# Patient Record
Sex: Male | Born: 2000 | Race: Black or African American | Hispanic: No | Marital: Single | Smoking: Never smoker
Health system: Southern US, Community
[De-identification: ages and names within clinical notes are randomized; demographics above are authoritative.]

## PROBLEM LIST (undated history)

## (undated) DIAGNOSIS — R569 Unspecified convulsions: Secondary | ICD-10-CM

## (undated) HISTORY — DX: Unspecified convulsions: R56.9

---
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
## 2021-03-31 ENCOUNTER — Other Ambulatory Visit: Payer: Self-pay

## 2021-03-31 ENCOUNTER — Emergency Department (HOSPITAL_COMMUNITY)
Admission: EM | Admit: 2021-03-31 | Discharge: 2021-04-01 | Discharge status: Against Medical Advice | Payer: Federal, State, Local not specified - PPO

## 2021-03-31 NOTE — ED Notes (Signed)
Pt called for triage with no response. 

## 2021-04-17 ENCOUNTER — Ambulatory Visit
Admission: RE | Admit: 2021-04-17 | Discharge: 2021-04-17 | Disposition: A | Discharge status: Home / Self Care | Payer: Federal, State, Local not specified - PPO | Source: Ambulatory Visit | Attending: Family Medicine | Admitting: Family Medicine

## 2021-04-17 ENCOUNTER — Ambulatory Visit: Payer: Federal, State, Local not specified - PPO | Admitting: Pulmonary Disease

## 2021-04-17 ENCOUNTER — Other Ambulatory Visit: Payer: Self-pay | Admitting: Family Medicine

## 2021-04-17 DIAGNOSIS — M79645 Pain in left finger(s): Secondary | ICD-10-CM

## 2021-06-08 ENCOUNTER — Encounter: Payer: Self-pay | Admitting: Neurology

## 2021-06-08 ENCOUNTER — Ambulatory Visit (INDEPENDENT_AMBULATORY_CARE_PROVIDER_SITE_OTHER): Payer: Federal, State, Local not specified - PPO | Admitting: Neurology

## 2021-06-08 VITALS — BP 129/85 | HR 91 | Ht 73.0 in | Wt 260.5 lb

## 2021-06-08 DIAGNOSIS — G40309 Generalized idiopathic epilepsy and epileptic syndromes, not intractable, without status epilepticus: Secondary | ICD-10-CM | POA: Diagnosis not present

## 2021-06-08 MED ORDER — LACOSAMIDE 100 MG PO TABS: 100.0000 mg | ORAL_TABLET | Freq: Two times a day (BID) | ORAL | 4 refills | Status: DC

## 2021-06-08 NOTE — Patient Instructions (Signed)
Continue with Vimpat 100 mg twice daily, refill given to patient ?Follow-up in 1 year, at that time if no reported seizure, we will obtain a 24 to 48-hours ambulatory EEG and based on result we will discuss coming off antiseizure medication. ?

## 2021-06-08 NOTE — Progress Notes (Signed)
? ?[facility] ? ?PATIENT: Curtis Stark ?DOB: [birth year] ? ?REFERRING CLINICIAN: No ref. provider found ?HISTORY FROM: Patient and mother over the phone  ?REASON FOR VISIT: Establish Care   ? ? ?HISTORICAL ? ?CHIEF COMPLAINT:  ?Chief Complaint  ?Patient presents with  ? Follow-up  ?  Rm 13. Alone. ?Denies any new seizure activity.  ? ?INTERVAL HISTORY 06/08/2021:  ?Patient presents today for follow-up, since last visit in September, he denies any additional seizures.  At last visit his Vimpat level was undetected.  I did contact him and stressed the need to be compliant with medication.  He still on Vimpat 100 mg twice daily, now reports compliance with the medication.  Denies any side effect from the medication.  He is interested in coming off medication.  His last seizure was in 2014. ? ?HISTORY OF PRESENT ILLNESS:  ?This is a 21 year old gentleman who is presenting to establish care for his seizure disorder.  Per mom seizure started when he was in elementary.  At that time he was having blank stare and head without wall turning to the right.  Then seizures involve to show upper body turning to the right with blank stare and stiffness.  Mother did denies any tongue biting urinary incontinence or tonic-clonic activity.  Initially patient has been on multiple AEDs including Depakote, topiramate, levetiracetam, Lamictal, Tegretol and clonazepam.  In 2014 he was admitted to the EMU following the study at that time he was on Depakote and topiramate was having a lot of seizure.  On discharge from that admission he was started on lacosamide 100 mg twice daily and no seizures since then.  His last seizure was in 2014.  He moved from [place] to [place] to attend no current currently in ENT state, he is studying business administration.  No other complaint.  Reported that his diet is good, sleep is good, and he exercises.  No other complaint or concern.   ? ? ?Handedness: Right handed  ? ?Seizure  Type: Focal per semiology (body turning to the right and stiffness) but generalized electrographically.  ? ?Current frequency: No seizures since 2014 ? ?Any injuries from seizures: None ? ?Seizure risk factors: None  ? ?Previous ASMs: Depakote, Topiramate, Keppra, Lamictal, Tegretol, Clonazepam  ? ?Currenty ASMs: Vimpat 100 gm BID  ? ?ASMs side effects: None  ? ?Brain Images: 2014: Normal brain MRI. ? ?Previous EEGs:  ? ? ?OTHER MEDICAL CONDITIONS: None  ? ?REVIEW OF SYSTEMS: Full 14 system review of systems performed and negative with exception of: None  ? ?ALLERGIES: ?Allergies  ?Allergen Reactions  ? Carbamazepine Hives, Itching, Rash and Swelling  ?  Red man rash  ? Clonazepam   ?  Red man rash ? ?Other reaction(s): Vancomycin Infusion Reaction ?Other reaction(s): Red Man Syndrome ?Can take in low doses for emergencies only  ?Can take in low doses for emergencies only  ?Other reaction(s): Red Man Syndrome ?Can take in low doses for emergencies only  ?Other reaction(s): Red Man Syndrome ?Can take in low doses for emergencies only  ?Can take in low doses for emergencies only  ?Can take in low doses for emergencies only  ?Can take in low doses for emergencies only  ?Other reaction(s): Vancomycin Infusion Reaction ?Can take in low doses for emergencies only  ?Other reaction(s): Red Man Syndrome ?Can take in low doses for emergencies only  ?Can take in low doses for emergencies only  ?Other reaction(s): Red Man Syndrome ?Can take in low doses  for emergencies only  ?Other reaction(s): Red Man Syndrome ?Can take in low doses for emergencies only  ?Can take in low doses for emergencies only  ?Can take in low doses for emergencies only  ?Can take in low doses for emergencies only  ?Can take in low doses for emergencies only  ?  ? Levetiracetam Other (See Comments)  ?  Mood changes ?Emotional, crying, aggressive ?Other reaction(s): Aggression ?Emotional, crying, aggressive ?Emotional, crying, aggressive ?Emotional,  crying, aggressive ? ?Emotional, crying, aggressive ?Emotional, crying, aggressive ?Other reaction(s): Other (See Comments), Other (See Comments), Other (see comments) ?Emotional, crying, aggressive ?Emotional, crying, aggressive ?Other reaction(s): Aggression ?Emotional, crying, aggressive ?Emotional, crying, aggressive ?Emotional, crying, aggressive ? ?Emotional, crying, aggressive ?Emotional, crying, aggressive ?Emotional, crying, aggressive ?  ? Other Other (See Comments)  ?  Other reaction(s): Other ?Seasonal allergies ?Seasonal allergies ?Other reaction(s): Other (See Comments) ?Seasonal allergies ?Seasonal allergies ?Seasonal allergies ?Seasonal allergies ?Seasonal allergies ?Other reaction(s): Other (see comments), Other (See Comments) ?Seasonal allergies ?Seasonal allergies ?Seasonal allergies ?Other reaction(s): Other (See Comments) ?Seasonal allergies ?Seasonal allergies ?Seasonal allergies ?Seasonal allergies ?Seasonal allergies ?Seasonal allergies ?  ? Topiramate Other (See Comments)  ?  Per mom made him have Stark seizures., ?Per mom made him have Stark seizures., ?Per mom made him have Stark seizures., ?Per mom made him have Stark seizures., ?Per mom made him have Stark seizures., ?Per mom made him have Stark seizures., ?Per mom made him have Stark seizures., ?Other reaction(s): Other (see comments), Other (See Comments) ?Per mom made him have Stark seizures., ?Per mom made him have Stark seizures., ?Per mom made him have Stark seizures., ?Per mom made him have Stark seizures., ?Per mom made him have Stark seizures., ?Per mom made him have Stark seizures., ?Per mom made him have Stark seizures., ?Per mom made him have Stark seizures., ?Per mom made him have Stark seizures., ?  ? Lamotrigine Rash  ?  Red man rash ? ?Drug rash  ?Drug rash  ?Other reaction(s): Rash ?Drug rash  ?Drug rash  ?Drug rash  ?Drug rash  ?Drug rash  ?Drug rash  ?Drug rash  ?Drug rash  ?Other reaction(s): Rash ?Drug rash  ?Drug rash  ?Drug rash   ?Drug rash  ?Drug rash  ?Drug rash  ?  ? Valproic Acid Other (See Comments) and Rash  ?  Other reaction(s): Other ?Aggression ?Aggression ?Other reaction(s): Aggression, Other (See Comments) ?Aggression ?Aggression ?Aggression ?Aggression ? ?Aggression ?Aggression ?Other reaction(s): Other (See Comments), Other (See Comments), Other (see comments) ?Aggression ?Aggression ?Aggression ?Other reaction(s): Aggression, Other (See Comments) ?Aggression ?Aggression ?Aggression ?Aggression ? ?Aggression ?Aggression ?Aggression ?  ? ? ?HOME MEDICATIONS: ?Outpatient Medications Prior to Visit  ?Medication Sig Dispense Refill  ? albuterol (VENTOLIN HFA) 108 (90 Base) MCG/ACT inhaler Inhale 2 puffs into the lungs every 6 (six) hours as needed for wheezing or shortness of breath. 1 each 6  ? budesonide-formoterol (SYMBICORT) 160-4.5 MCG/ACT inhaler Inhale 2 puffs into the lungs in the morning and at bedtime. 1 each 6  ? cetirizine (ZYRTEC) 10 MG tablet Take 10 mg by mouth daily.    ? cholecalciferol (VITAMIN D3) 25 MCG (1000 UNIT) tablet Take 1,000 Units by mouth daily.    ? fluticasone (FLONASE) 50 MCG/ACT nasal spray Place into both nostrils daily.    ? guanFACINE (TENEX) 1 MG tablet Take 1 mg by mouth in the morning and at bedtime.    ? Vitamin D, Ergocalciferol, (DRISDOL) 1.25 MG (50000 UNIT) CAPS capsule Take 1 capsule (50,000  Units total) by mouth every 7 (seven) days. 8 capsule 0  ? lacosamide (VIMPAT) 50 MG TABS tablet Take 2 tablets (100 mg total) by mouth 2 (two) times daily. 180 tablet 1  ? ?No facility-administered medications prior to visit.  ? ? ?PAST MEDICAL HISTORY: ?Past Medical History:  ?Diagnosis Date  ? Seizures ([facility])   ? ? ?PAST SURGICAL HISTORY: ?No past surgical history on file. ? ?FAMILY HISTORY: ?No family history on file. ? ?SOCIAL HISTORY: ?Social History  ? ?Socioeconomic History  ? Marital status: Single  ?  Spouse name: Not on file  ? Number of children: Not on file  ? Years of education: Not on  file  ? Highest education level: Not on file  ?Occupational History  ? Not on file  ?Tobacco Use  ? Smoking status: Never  ? Smokeless tobacco: Never  ?Substance and Sexual Activity  ? Alcohol use: Never  ?

## 2022-05-21 ENCOUNTER — Other Ambulatory Visit: Payer: Federal, State, Local not specified - PPO | Admitting: *Deleted

## 2022-06-11 ENCOUNTER — Ambulatory Visit: Payer: Federal, State, Local not specified - PPO | Admitting: Neurology

## 2022-06-28 IMAGING — DX DG CHEST 2V
2 series · 2 of 2 positions shown · non-contrast
Comparison: None.

CLINICAL DATA: 19-year-old male with shortness of breath and chest
pain since yesterday during asthma attack.

EXAM:
CHEST - 2 VIEW

[chest pa]
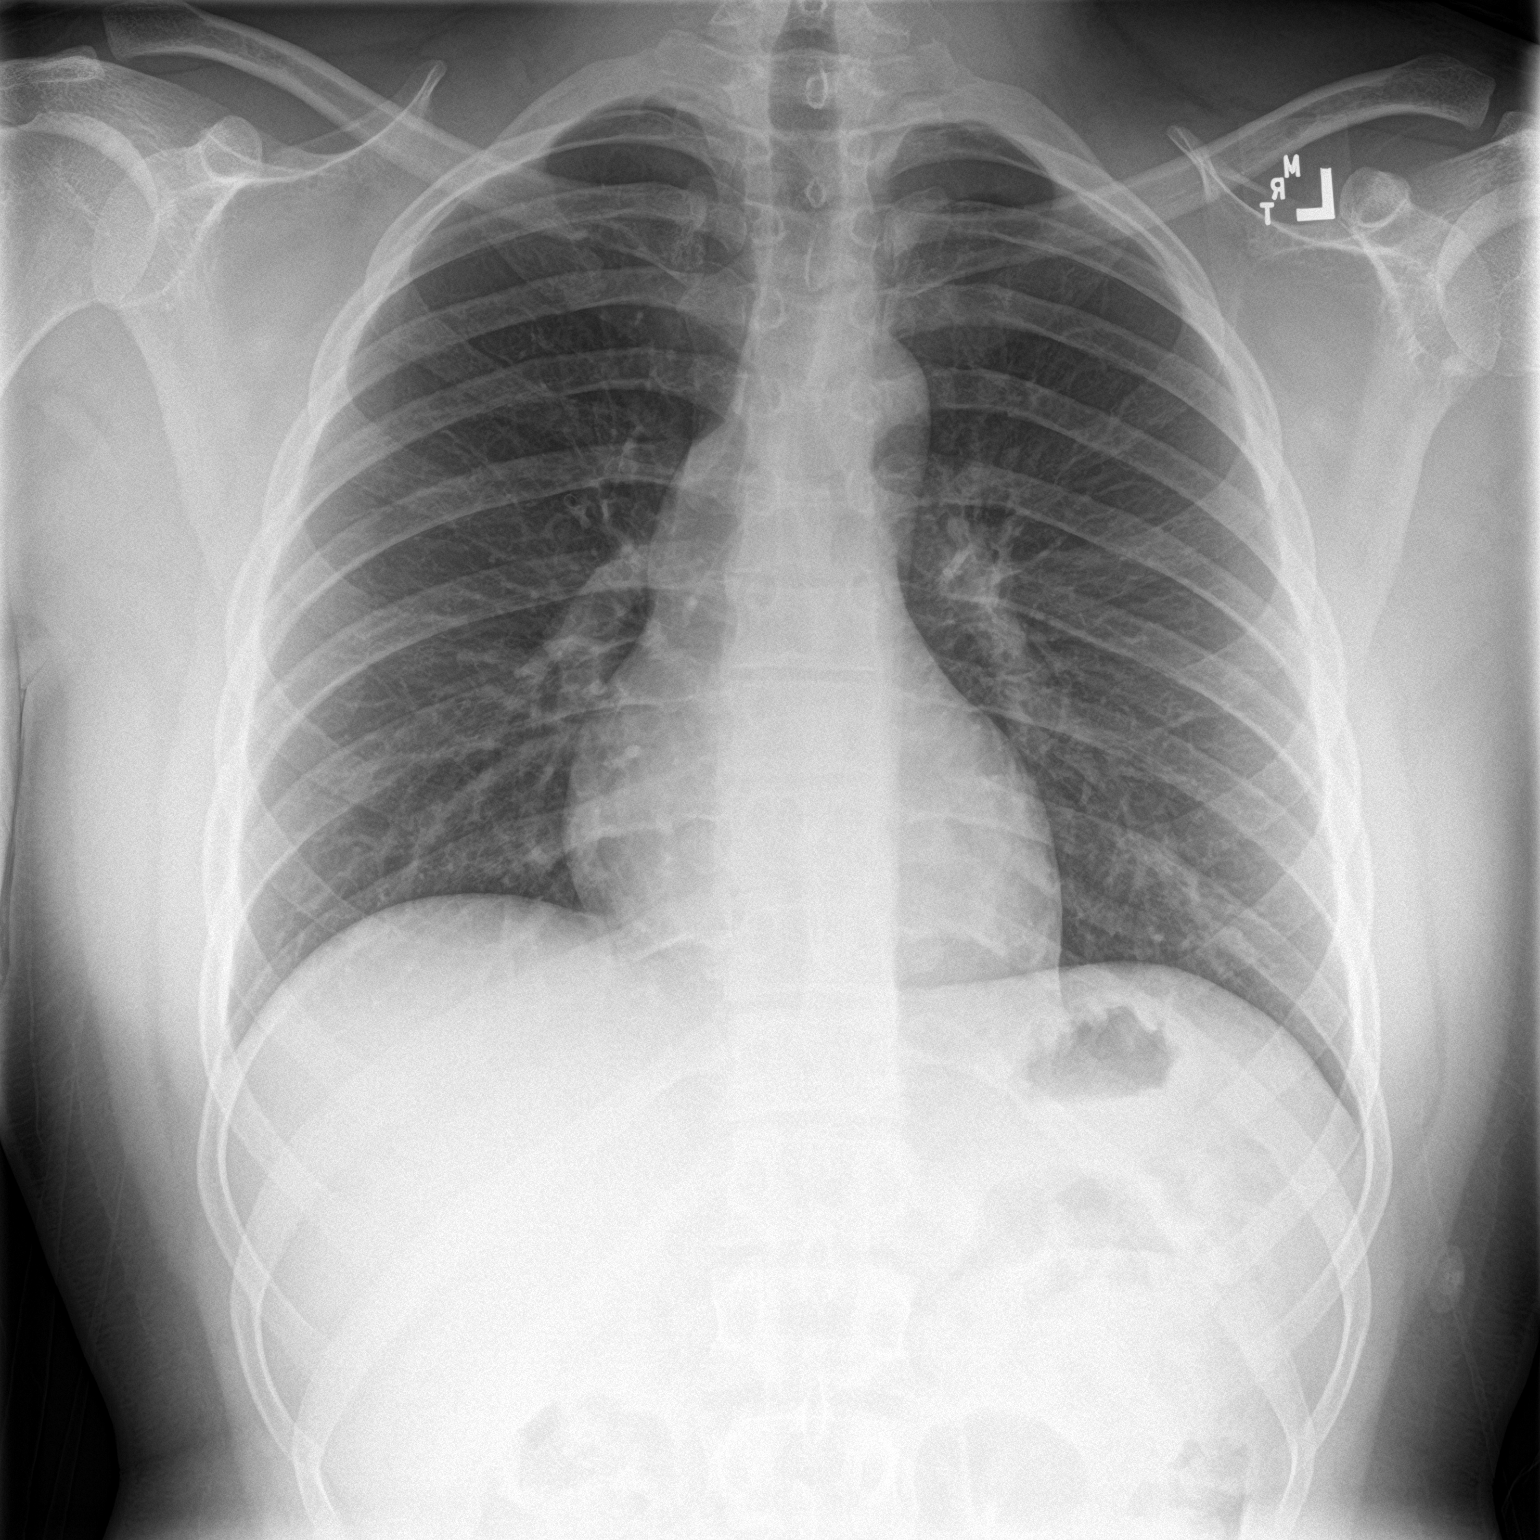

[chest lat]
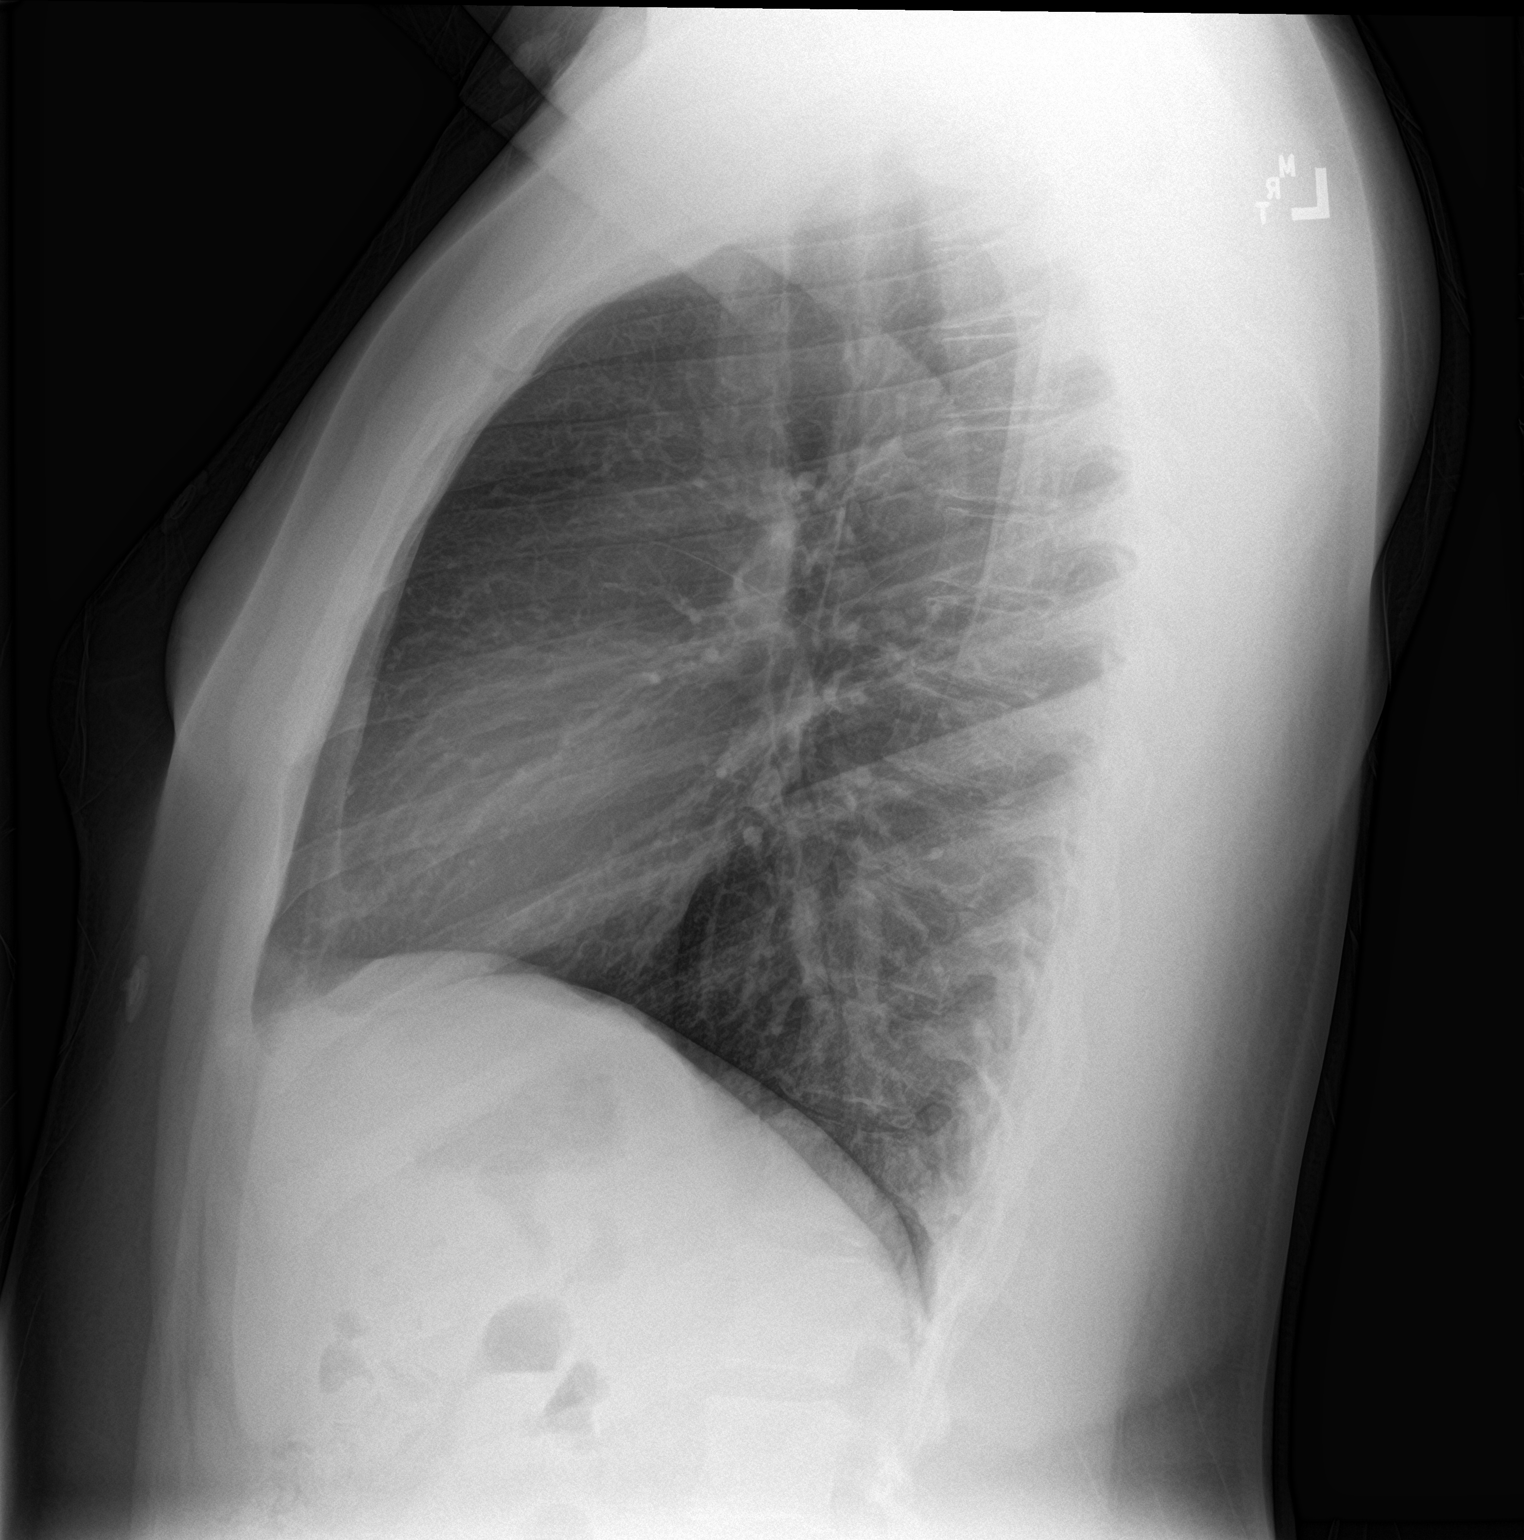

[2 of 2 positions shown; findings below may reference images not displayed]

FINDINGS: Lung volumes and mediastinal contours are within normal limits.
Visualized tracheal air column is within normal limits. Lung
markings appear within normal limits and both lungs appear clear. No
pneumothorax or pleural effusion.

No osseous abnormality identified. Negative visible bowel gas
pattern.
IMPRESSION: Negative.  No cardiopulmonary abnormality.

## 2022-10-24 NOTE — Progress Notes (Deleted)
No chief complaint on file.   HISTORY OF PRESENT ILLNESS:  10/24/22 ALL:  Curtis Stark is a 22 y.o. male here today for follow up for seizures. He continues Vimpat 100mg  BID.     HISTORY (copied from Dr Karie Georges previous note)  INTERVAL HISTORY 06/08/2021:  Patient presents today for follow-up, since last visit in September, he denies any additional seizures.  At last visit his Vimpat level was undetected.  I did contact him and stressed the need to be compliant with medication.  He still on Vimpat 100 mg twice daily, now reports compliance with the medication.  Denies any side effect from the medication.  He is interested in coming off medication.  His last seizure was in 2014.  HISTORY OF PRESENT ILLNESS:  This is a 22 year old gentleman who is presenting to establish care for his seizure disorder.  Per mom seizure started when he was in elementary.  At that time he was having blank stare and head without wall turning to the right.  Then seizures involve to show upper body turning to the right with blank stare and stiffness.  Mother did denies any tongue biting urinary incontinence or tonic-clonic activity.  Initially patient has been on multiple AEDs including Depakote, topiramate, levetiracetam, Lamictal, Tegretol and clonazepam.  In 2014 he was admitted to the EMU following the study at that time he was on Depakote and topiramate was having a lot of seizure.  On discharge from that admission he was started on lacosamide 100 mg twice daily and no seizures since then.  His last seizure was in 2014.  He moved from [place] to [place] to attend no current currently in ENT state, he is studying business administration.  No other complaint.  Reported that his diet is good, sleep is good, and he exercises.  No other complaint or concern.     Handedness: Right handed   Seizure Type: Focal per semiology (body turning to the right and stiffness) but generalized electrographically.    Current frequency: No seizures since 2014  Any injuries from seizures: None  Seizure risk factors: None   Previous ASMs: Depakote, Topiramate, Keppra, Lamictal, Tegretol, Clonazepam   Currenty ASMs: Vimpat 100 gm BID   ASMs side effects: None   Brain Images: 2014: Normal brain MRI.  Previous EEGs:    OTHER MEDICAL CONDITIONS: None    REVIEW OF SYSTEMS: Out of a complete 14 system review of symptoms, the patient complains only of the following symptoms, and all other reviewed systems are negative.   ALLERGIES: Allergies  Allergen Reactions   Carbamazepine Hives, Itching, Rash and Swelling    Red man rash   Clonazepam     Red man rash  Other reaction(s): Vancomycin Infusion Reaction Other reaction(s): Red Man Syndrome Can take in low doses for emergencies only  Can take in low doses for emergencies only  Other reaction(s): Red Man Syndrome Can take in low doses for emergencies only  Other reaction(s): Red Man Syndrome Can take in low doses for emergencies only  Can take in low doses for emergencies only  Can take in low doses for emergencies only  Can take in low doses for emergencies only  Other reaction(s): Vancomycin Infusion Reaction Can take in low doses for emergencies only  Other reaction(s): Red Man Syndrome Can take in low doses for emergencies only  Can take in low doses for emergencies only  Other reaction(s): Red Man Syndrome Can take in low doses for emergencies  only  Other reaction(s): Red Man Syndrome Can take in low doses for emergencies only  Can take in low doses for emergencies only  Can take in low doses for emergencies only  Can take in low doses for emergencies only  Can take in low doses for emergencies only     Levetiracetam Other (See Comments)    Mood changes Emotional, crying, aggressive Other reaction(s): Aggression Emotional, crying, aggressive Emotional, crying, aggressive Emotional, crying, aggressive  Emotional, crying,  aggressive Emotional, crying, aggressive Other reaction(s): Other (See Comments), Other (See Comments), Other (see comments) Emotional, crying, aggressive Emotional, crying, aggressive Other reaction(s): Aggression Emotional, crying, aggressive Emotional, crying, aggressive Emotional, crying, aggressive  Emotional, crying, aggressive Emotional, crying, aggressive Emotional, crying, aggressive    Other Other (See Comments)    Other reaction(s): Other Seasonal allergies Seasonal allergies Other reaction(s): Other (See Comments) Seasonal allergies Seasonal allergies Seasonal allergies Seasonal allergies Seasonal allergies Other reaction(s): Other (see comments), Other (See Comments) Seasonal allergies Seasonal allergies Seasonal allergies Other reaction(s): Other (See Comments) Seasonal allergies Seasonal allergies Seasonal allergies Seasonal allergies Seasonal allergies Seasonal allergies    Topiramate Other (See Comments)    Per mom made him have more seizures., Per mom made him have more seizures., Per mom made him have more seizures., Per mom made him have more seizures., Per mom made him have more seizures., Per mom made him have more seizures., Per mom made him have more seizures., Other reaction(s): Other (see comments), Other (See Comments) Per mom made him have more seizures., Per mom made him have more seizures., Per mom made him have more seizures., Per mom made him have more seizures., Per mom made him have more seizures., Per mom made him have more seizures., Per mom made him have more seizures., Per mom made him have more seizures., Per mom made him have more seizures.,    Lamotrigine Rash    Red man rash  Drug rash  Drug rash  Other reaction(s): Rash Drug rash  Drug rash  Drug rash  Drug rash  Drug rash  Drug rash  Drug rash  Drug rash  Other reaction(s): Rash Drug rash  Drug rash  Drug rash  Drug rash  Drug rash  Drug rash      Valproic Acid Other (See Comments) and Rash    Other reaction(s): Other Aggression Aggression Other reaction(s): Aggression, Other (See Comments) Aggression Aggression Aggression Aggression  Aggression Aggression Other reaction(s): Other (See Comments), Other (See Comments), Other (see comments) Aggression Aggression Aggression Other reaction(s): Aggression, Other (See Comments) Aggression Aggression Aggression Aggression  Aggression Aggression Aggression      HOME MEDICATIONS: Outpatient Medications Prior to Visit  Medication Sig Dispense Refill   albuterol (VENTOLIN HFA) 108 (90 Base) MCG/ACT inhaler Inhale 2 puffs into the lungs every 6 (six) hours as needed for wheezing or shortness of breath. 1 each 6   budesonide-formoterol (SYMBICORT) 160-4.5 MCG/ACT inhaler Inhale 2 puffs into the lungs in the morning and at bedtime. 1 each 6   cetirizine (ZYRTEC) 10 MG tablet Take 10 mg by mouth daily.     cholecalciferol (VITAMIN D3) 25 MCG (1000 UNIT) tablet Take 1,000 Units by mouth daily.     fluticasone (FLONASE) 50 MCG/ACT nasal spray Place into both nostrils daily.     guanFACINE (TENEX) 1 MG tablet Take 1 mg by mouth in the morning and at bedtime.     lacosamide 100 MG TABS Take 1 tablet (100 mg total) by mouth 2 (two) times  daily. 180 tablet 4   Vitamin D, Ergocalciferol, (DRISDOL) 1.25 MG (50000 UNIT) CAPS capsule Take 1 capsule (50,000 Units total) by mouth every 7 (seven) days. 8 capsule 0   No facility-administered medications prior to visit.     PAST MEDICAL HISTORY: Past Medical History:  Diagnosis Date   Seizures (HCC)      PAST SURGICAL HISTORY: No past surgical history on file.   FAMILY HISTORY: No family history on file.   SOCIAL HISTORY: Social History   Socioeconomic History   Marital status: Single    Spouse name: Not on file   Number of children: Not on file   Years of education: Not on file   Highest education level: Not on file   Occupational History   Not on file  Tobacco Use   Smoking status: Never   Smokeless tobacco: Never  Substance and Sexual Activity   Alcohol use: Never   Drug use: Never   Sexual activity: Not on file  Other Topics Concern   Not on file  Social History Narrative   Lives alone on college campus, [employer] in [place]   Right Handed   Drinks caffeine occasionally   Social Determinants of Health   Financial Resource Strain: Not on file  Food Insecurity: Not on file  Transportation Needs: Not on file  Physical Activity: Not on file  Stress: Not on file  Social Connections: Not on file  Intimate Partner Violence: Not on file     PHYSICAL EXAM  There were no vitals filed for this visit. There is no height or weight on file to calculate BMI.  Generalized: Well developed, in no acute distress  Cardiology: normal rate and rhythm, no murmur auscultated  Respiratory: clear to auscultation bilaterally    Neurological examination  Mentation: Alert oriented to time, place, history taking. Follows all commands speech and language fluent Cranial nerve II-XII: Pupils were equal round reactive to light. Extraocular movements were full, visual field were full on confrontational test. Facial sensation and strength were normal. Uvula tongue midline. Head turning and shoulder shrug  were normal and symmetric. Motor: The motor testing reveals 5 over 5 strength of all 4 extremities. Good symmetric motor tone is noted throughout.  Sensory: Sensory testing is intact to soft touch on all 4 extremities. No evidence of extinction is noted.  Coordination: Cerebellar testing reveals good finger-nose-finger and heel-to-shin bilaterally.  Gait and station: Gait is normal. Tandem gait is normal. Romberg is negative. No drift is seen.  Reflexes: Deep tendon reflexes are symmetric and normal bilaterally.    DIAGNOSTIC DATA (LABS, IMAGING, TESTING) - I reviewed patient records, labs, notes, testing and  imaging myself where available.  Lab Results  Component Value Date   WBC 9.2 10/24/2020   HGB 14.8 10/24/2020   HCT 45.1 10/24/2020   MCV 90.6 10/24/2020   PLT 263 10/24/2020      Component Value Date/Time   NA 141 12/08/2020 1524   K 4.5 12/08/2020 1524   CL 102 12/08/2020 1524   CO2 26 12/08/2020 1524   GLUCOSE 69 12/08/2020 1524   GLUCOSE 100 (H) 10/24/2020 0712   BUN 8 12/08/2020 1524   CREATININE 0.94 12/08/2020 1524   CALCIUM 9.9 12/08/2020 1524   GFRNONAA >60 10/24/2020 0712   No results found for: "CHOL", "HDL", "LDLCALC", "LDLDIRECT", "TRIG", "CHOLHDL" No results found for: "HGBA1C" No results found for: "VITAMINB12" No results found for: "TSH"      No data to display  No data to display           ASSESSMENT AND PLAN  22 y.o. year old male  has a past medical history of Seizures (HCC). here with    No diagnosis found.  Clement Sayres ***.  Healthy lifestyle habits encouraged. *** will follow up with PCP as directed. *** will return to see me in ***, sooner if needed. *** verbalizes understanding and agreement with this plan.   No orders of the defined types were placed in this encounter.    No orders of the defined types were placed in this encounter.    Shawnie Dapper, MSN, FNP-C 10/24/2022, 10:07 AM  [facility] [address] [place], [place] [address] [phone]

## 2022-10-25 ENCOUNTER — Ambulatory Visit: Payer: Federal, State, Local not specified - PPO | Admitting: Family Medicine

## 2022-10-25 DIAGNOSIS — G40309 Generalized idiopathic epilepsy and epileptic syndromes, not intractable, without status epilepticus: Secondary | ICD-10-CM

## 2022-10-25 NOTE — Patient Instructions (Signed)
Below is our plan:  We will continue to monitor for now. I will order an EEG for monitoring. We will discuss your case with Curtis Stark and determine next steps in treatment plan.   Please make sure you are consistent with timing of seizure medication. I recommend annual visit with primary care provider (PCP) for complete physical and routine blood work. I recommend daily intake of vitamin D (400-800iu) and calcium (800-1000mg ) for bone health. Discuss Dexa screening with PCP.   According to [place] law, you can not drive unless you are seizure / syncope free for at least 6 months and under physician's care.  Please maintain precautions. Do not participate in activities where a loss of awareness could harm you or someone else. No swimming alone, no tub bathing, no hot tubs, no driving, no operating motorized vehicles (cars, ATVs, motocycles, etc), lawnmowers, power tools or firearms. No standing at heights, such as rooftops, ladders or stairs. Avoid hot objects such as stoves, heaters, open fires. Wear a helmet when riding a bicycle, scooter, skateboard, etc. and avoid areas of traffic. Set your water heater to 120 degrees or less.  SUDEP is the sudden, unexpected death of someone with epilepsy, who was otherwise healthy. In SUDEP cases, no other cause of death is found when an autopsy is done. Each year, more than 1 in 1,000 people with epilepsy die from SUDEP. This is the leading cause of death in people with uncontrolled seizures. Until further answers are available, the best way to prevent SUDEP is to lower your risk by controlling seizures. Research has found that people with all types of epilepsy that experience convulsive seizures can be at risk.  Please make sure you are staying well hydrated. I recommend 50-60 ounces daily. Well balanced diet and regular exercise encouraged. Consistent sleep schedule with 6-8 hours recommended.   Please continue follow up with care team as directed.   Follow up  with Curtis Stark in 3-4 months   You may receive a survey regarding today's visit. I encourage you to leave honest feed back as I do use this information to improve patient care. Thank you for seeing me today!

## 2022-10-25 NOTE — Progress Notes (Signed)
Chief Complaint  Patient presents with   Follow-up    Pt in room 1. Here for seizures. Pt reports no recent seizures, pt  stopped Vimpat in beginning of this year. Pt reports he feels fine and has been doing well off medications.     HISTORY OF PRESENT ILLNESS:  10/29/22 ALL:  Curtis Stark is a 22 y.o. male here today for follow up for seizures. He was last seen by Dr Teresa Coombs 05/2021 and educated on importance of taking Vimpat 100mg  consistently, BID. Since, he reports doing well. He stopped  Vimpat in 03/2022. He had difficulty getting med from pharmacy so he stopped picking it up. He was tolerating med well. He has not had a seizure. 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 He lives with three roommates. 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   Previous AEDs including Depakote, topiramate, levetiracetam, Lamictal, Tegretol and clonazepam  HISTORY (copied from Dr Karie Georges previous note)  INTERVAL HISTORY 06/08/2021:  Patient presents today for follow-up, since last visit in September, he denies any additional seizures.  At last visit his Vimpat level was undetected.  I did contact him and stressed the need to be compliant with medication.  He still on Vimpat 100 mg twice daily, now reports compliance with the medication.  Denies any side effect from the medication.  He is interested in coming off medication.  His last seizure was in 2014.  HISTORY OF PRESENT ILLNESS:  This is a 22 year old gentleman who is presenting to establish care for his seizure disorder.  Per mom seizure started when he was in elementary.  At that time he was having blank stare and head without wall turning to the right.  Then seizures involve to show upper body turning to the right with blank stare and stiffness.  Mother did denies any tongue biting urinary incontinence or tonic-clonic activity.  Initially patient has been on multiple AEDs including Depakote,  topiramate, levetiracetam, Lamictal, Tegretol and clonazepam.  In 2014 he was admitted to the EMU following the study at that time he was on Depakote and topiramate was having a lot of seizure.  On discharge from that admission he was started on lacosamide 100 mg twice daily and no seizures since then.  His last seizure was in 2014.  He moved from [place] to [place] to attend no current currently in ENT state, he is studying business administration.  No other complaint.  Reported that his diet is good, sleep is good, and he exercises.  No other complaint or concern.    Handedness: Right handed   Seizure Type: Focal per semiology (body turning to the right and stiffness) but generalized electrographically.   Current frequency: No seizures since 2014  Any injuries from seizures: None  Seizure risk factors: None   Previous ASMs: Depakote, Topiramate, Keppra, Lamictal, Tegretol, Clonazepam   Currenty ASMs: Vimpat 100 gm BID   ASMs side effects: None   Brain Images: 2014: Normal brain MRI.  Previous EEGs:    OTHER MEDICAL CONDITIONS: None    REVIEW OF SYSTEMS: Out of a complete 14 system review of symptoms, the patient complains only of the following symptoms, none and all other reviewed systems are negative.   ALLERGIES: Allergies  Allergen Reactions   Carbamazepine Hives, Itching, Rash and Swelling    Red man rash   Clonazepam     Red man rash  Other reaction(s): Vancomycin Infusion Reaction  Other reaction(s): Red Man Syndrome Can take in low doses for emergencies only  Can take in low doses for emergencies only  Other reaction(s): Red Man Syndrome Can take in low doses for emergencies only  Other reaction(s): Red Man Syndrome Can take in low doses for emergencies only  Can take in low doses for emergencies only  Can take in low doses for emergencies only  Can take in low doses for emergencies only  Other reaction(s): Vancomycin Infusion Reaction Can take in low  doses for emergencies only  Other reaction(s): Red Man Syndrome Can take in low doses for emergencies only  Can take in low doses for emergencies only  Other reaction(s): Red Man Syndrome Can take in low doses for emergencies only  Other reaction(s): Red Man Syndrome Can take in low doses for emergencies only  Can take in low doses for emergencies only  Can take in low doses for emergencies only  Can take in low doses for emergencies only  Can take in low doses for emergencies only     Levetiracetam Other (See Comments)    Mood changes Emotional, crying, aggressive Other reaction(s): Aggression Emotional, crying, aggressive Emotional, crying, aggressive Emotional, crying, aggressive  Emotional, crying, aggressive Emotional, crying, aggressive Other reaction(s): Other (See Comments), Other (See Comments), Other (see comments) Emotional, crying, aggressive Emotional, crying, aggressive Other reaction(s): Aggression Emotional, crying, aggressive Emotional, crying, aggressive Emotional, crying, aggressive  Emotional, crying, aggressive Emotional, crying, aggressive Emotional, crying, aggressive    Other Other (See Comments)    Other reaction(s): Other Seasonal allergies Seasonal allergies Other reaction(s): Other (See Comments) Seasonal allergies Seasonal allergies Seasonal allergies Seasonal allergies Seasonal allergies Other reaction(s): Other (see comments), Other (See Comments) Seasonal allergies Seasonal allergies Seasonal allergies Other reaction(s): Other (See Comments) Seasonal allergies Seasonal allergies Seasonal allergies Seasonal allergies Seasonal allergies Seasonal allergies    Topiramate Other (See Comments)    Per mom made him have more seizures., Per mom made him have more seizures., Per mom made him have more seizures., Per mom made him have more seizures., Per mom made him have more seizures., Per mom made him have more seizures., Per mom  made him have more seizures., Other reaction(s): Other (see comments), Other (See Comments) Per mom made him have more seizures., Per mom made him have more seizures., Per mom made him have more seizures., Per mom made him have more seizures., Per mom made him have more seizures., Per mom made him have more seizures., Per mom made him have more seizures., Per mom made him have more seizures., Per mom made him have more seizures.,    Lamotrigine Rash    Red man rash  Drug rash  Drug rash  Other reaction(s): Rash Drug rash  Drug rash  Drug rash  Drug rash  Drug rash  Drug rash  Drug rash  Drug rash  Other reaction(s): Rash Drug rash  Drug rash  Drug rash  Drug rash  Drug rash  Drug rash     Valproic Acid Other (See Comments) and Rash    Other reaction(s): Other Aggression Aggression Other reaction(s): Aggression, Other (See Comments) Aggression Aggression Aggression Aggression  Aggression Aggression Other reaction(s): Other (See Comments), Other (See Comments), Other (see comments) Aggression Aggression Aggression Other reaction(s): Aggression, Other (See Comments) Aggression Aggression Aggression Aggression  Aggression Aggression Aggression      HOME MEDICATIONS: Outpatient Medications Prior to Visit  Medication Sig Dispense Refill   albuterol (VENTOLIN HFA) 108 (90 Base) MCG/ACT inhaler Inhale  2 puffs into the lungs every 6 (six) hours as needed for wheezing or shortness of breath. 1 each 6   budesonide-formoterol (SYMBICORT) 160-4.5 MCG/ACT inhaler Inhale 2 puffs into the lungs in the morning and at bedtime. 1 each 6   cetirizine (ZYRTEC) 10 MG tablet Take 10 mg by mouth daily.     fluticasone (FLONASE) 50 MCG/ACT nasal spray Place into both nostrils daily.     cholecalciferol (VITAMIN D3) 25 MCG (1000 UNIT) tablet Take 1,000 Units by mouth daily. (Patient not taking: Reported on 10/29/2022)     guanFACINE (TENEX) 1 MG tablet Take 1 mg by mouth  in the morning and at bedtime. (Patient not taking: Reported on 10/29/2022)     lacosamide 100 MG TABS Take 1 tablet (100 mg total) by mouth 2 (two) times daily. 180 tablet 4   Vitamin D, Ergocalciferol, (DRISDOL) 1.25 MG (50000 UNIT) CAPS capsule Take 1 capsule (50,000 Units total) by mouth every 7 (seven) days. (Patient not taking: Reported on 10/29/2022) 8 capsule 0   No facility-administered medications prior to visit.     PAST MEDICAL HISTORY: Past Medical History:  Diagnosis Date   Seizures (HCC)      PAST SURGICAL HISTORY: History reviewed. No pertinent surgical history.   FAMILY HISTORY: History reviewed. No pertinent family history.   SOCIAL HISTORY: Social History   Socioeconomic History   Marital status: Single    Spouse name: Not on file   Number of children: Not on file   Years of education: Not on file   Highest education level: Not on file  Occupational History   Not on file  Tobacco Use   Smoking status: Never   Smokeless tobacco: Never  Substance and Sexual Activity   Alcohol use: Never   Drug use: Never   Sexual activity: Not on file  Other Topics Concern   Not on file  Social History Narrative   L[place] alone [place] [employer] [place] in [place]   Right Handed   Drinks caffeine occasionally   Social Determinants of Health   Financial Resource Strain: Not on file  Food Insecurity: Not on file  Transportation Needs: Not on file  Physical Activity: Not on file  Stress: Not on file  Social Connections: Not on file  Intimate Partner Violence: Not on file     PHYSICAL EXAM  Vitals:   10/29/22 0859  BP: 110/86  Pulse: 64  Weight: 235 lb (106.6 kg)  Height: 6' (1.829 m)   Body mass index is 31.87 kg/m.  Generalized: Well developed, in no acute distress  Cardiology: normal rate and rhythm, no murmur auscultated  Respiratory: clear to auscultation bilaterally    Neurological examination  Mentation: Alert oriented to time, place,  history taking. Follows all commands speech and language fluent Cranial nerve II-XII: Pupils were equal round reactive to light. Extraocular movements were full, visual field were full on confrontational test. Facial sensation and strength were normal. Uvula tongue midline. Head turning and shoulder shrug  were normal and symmetric. Motor: The motor testing reveals 5 over 5 strength of all 4 extremities. Good symmetric motor tone is noted throughout.  Sensory: Sensory testing is intact to soft touch on all 4 extremities. No evidence of extinction is noted.  Coordination: Cerebellar testing reveals good finger-nose-finger and heel-to-shin bilaterally.  Gait and station: Gait is normal. Tandem gait is normal. Romberg is negative. No drift is seen.  Reflexes: Deep tendon reflexes are symmetric and normal bilaterally.  DIAGNOSTIC DATA (LABS, IMAGING, TESTING) - I reviewed patient records, labs, notes, testing and imaging myself where available.  Lab Results  Component Value Date   WBC 9.2 10/24/2020   HGB 14.8 10/24/2020   HCT 45.1 10/24/2020   MCV 90.6 10/24/2020   PLT 263 10/24/2020      Component Value Date/Time   NA 141 12/08/2020 1524   K 4.5 12/08/2020 1524   CL 102 12/08/2020 1524   CO2 26 12/08/2020 1524   GLUCOSE 69 12/08/2020 1524   GLUCOSE 100 (H) 10/24/2020 0712   BUN 8 12/08/2020 1524   CREATININE 0.94 12/08/2020 1524   CALCIUM 9.9 12/08/2020 1524   GFRNONAA >60 10/24/2020 1610   No results found for: "CHOL", "HDL", "LDLCALC", "LDLDIRECT", "TRIG", "CHOLHDL" No results found for: "HGBA1C" No results found for: "VITAMINB12" No results found for: "TSH"      No data to display               No data to display           ASSESSMENT AND PLAN  22 y.o. year old male  has a past medical history of Seizures (HCC). here with    Generalized idiopathic epilepsy and epileptic syndromes, not intractable, without status epilepticus (HCC) - Plan: EEG adult  Ambrosio  CAVEN KNEISLEY reports discontinuing ASM about 8 months ago. He wished to be off ASM. He denies seizure activity since discontinuation. Last seizure 2014. I will repeat EEG. We will discuss case with Dr Teresa Coombs. He is willing to consider restarting Vimpat if recommended by Dr Teresa Coombs. We have reviewed s/s of seizure and driving restrictions should he have any concerning events. 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 He will monitor closely for aura. Healthy lifestyle habits encouraged. He will follow up with PCP as directed. He will return to see Dr Teresa Coombs in 3-4 months. He verbalizes understanding and agreement with this plan.   Orders Placed This Encounter  Procedures   EEG adult    Standing Status:   Future    Standing Expiration Date:   10/29/2023    Order Specific Question:   Where should this test be performed?    Answer:   [facility]    Order Specific Question:   Reason for exam    Answer:   Seizure     No orders of the defined types were placed in this encounter.   I spent 30 minutes of face-to-face and non-face-to-face time with patient.  This included previsit chart review, lab review, study review, order entry, electronic health record documentation, patient education.   Shawnie Dapper, MSN, FNP-C 10/29/2022, 9:38 AM  [facility] [address] [place], [place] [address] [phone]

## 2022-10-29 ENCOUNTER — Telehealth: Payer: Self-pay | Admitting: Neurology

## 2022-10-29 ENCOUNTER — Ambulatory Visit (INDEPENDENT_AMBULATORY_CARE_PROVIDER_SITE_OTHER): Payer: Federal, State, Local not specified - PPO | Admitting: Family Medicine

## 2022-10-29 ENCOUNTER — Encounter: Payer: Self-pay | Admitting: Family Medicine

## 2022-10-29 VITALS — BP 110/86 | HR 64 | Ht 72.0 in | Wt 235.0 lb

## 2022-10-29 DIAGNOSIS — G40309 Generalized idiopathic epilepsy and epileptic syndromes, not intractable, without status epilepticus: Secondary | ICD-10-CM

## 2022-10-29 NOTE — Telephone Encounter (Signed)
Pt called, wanted to know if he could still come to appt that was scheduled at 8:00 am. Informed pt would need to pay no show fee of $50. Pt wanted to come to the office to pay no show fee.

## 2022-11-05 ENCOUNTER — Ambulatory Visit: Payer: Federal, State, Local not specified - PPO | Admitting: Neurology

## 2022-11-05 DIAGNOSIS — G40309 Generalized idiopathic epilepsy and epileptic syndromes, not intractable, without status epilepticus: Secondary | ICD-10-CM

## 2022-11-08 NOTE — Procedures (Signed)
   History:  22 year old man with generalized epilepsy  EEG classification:  Awake and asleep  Duration: 26 minutes  Technical aspects: This EEG study was done with scalp electrodes positioned according to the 10-20 International system of electrode placement. Electrical activity was reviewed with band pass filter of 1-70Hz , sensitivity of 7 uV/mm, display speed of 70mm/sec with a 60Hz  notched filter applied as appropriate. EEG data were recorded continuously and digitally stored.   Description of the recording: The background rhythms of this recording consists of a fairly well modulated medium amplitude background activity of 9 Hz. As the record progresses, the patient initially is in the waking state, but appears to enter the early stage II sleep during the recording, with rudimentary sleep spindles and vertex sharp wave activity seen. During the wakeful state, photic stimulation was performed, and no abnormal responses were seen. Hyperventilation was also performed, no abnormal response seen. There were frequent burst of generalized spike and slow waves, polyspikes, 3-5 Hz, lasting up to 4 seconds duration seen during this recording. There was no seizures.   Abnormality: Frequent burst of generalized spike and slow waves, polyspikes, 3-5 Hz, lasting up to 4 seconds duration  Impression: This is an abnormal EEG recording in the waking and sleeping state due to presence of generalized epileptiform discharges. This is consistent with generalized epilepsy.    Windell Norfolk, MD [facility]

## 2022-11-09 NOTE — Progress Notes (Signed)
Restart antiseizure medication.

## 2022-11-12 ENCOUNTER — Telehealth: Payer: Self-pay | Admitting: Family Medicine

## 2022-11-12 MED ORDER — LACOSAMIDE 100 MG PO TABS: 100.0000 mg | ORAL_TABLET | Freq: Two times a day (BID) | ORAL | 1 refills | Status: DC

## 2022-11-12 NOTE — Telephone Encounter (Signed)
Can you guys please contact the patient and let hi know that Dr Teresa Coombs did review his EEG results. It shows concerns consistent with Epilepsy and we would like for him to resume his Vimpat. I have called in a new prescription for 100mg  twice daily. Please have him call with any trouble getting or tolerating the medication. Can you please make sure he has follow up with Dr Teresa Coombs or myself in the next 6 months. TY!

## 2022-11-12 NOTE — Telephone Encounter (Signed)
-----   Message from Windell Norfolk sent at 11/09/2022  8:19 AM EDT ----- Restart antiseizure medication.

## 2022-11-13 NOTE — Telephone Encounter (Signed)
Pt has been scheduled for his 6 mo f/u for 04-29-22 with a check in of 11:15 for a 11:45 Dr Teresa Coombs appointment.  This is FYI no call back requested

## 2023-01-04 ENCOUNTER — Telehealth: Payer: Self-pay | Admitting: Family Medicine

## 2023-01-04 MED ORDER — LACOSAMIDE 100 MG PO TABS: 100.0000 mg | ORAL_TABLET | Freq: Two times a day (BID) | ORAL | 1 refills | Status: AC

## 2023-01-04 NOTE — Telephone Encounter (Signed)
Pt's mother, Benedicto Naumann request all prescriptions refills for Lacosamide 100 MG TABS send to [facility] [address] [place], [place] [address] Phone: [phone]  Pt is out of medication. Previous pharmacy has closed

## 2023-01-04 NOTE — Telephone Encounter (Signed)
New Rx for Lacosamide sent to new pharmacy.

## 2023-01-25 ENCOUNTER — Ambulatory Visit (HOSPITAL_BASED_OUTPATIENT_CLINIC_OR_DEPARTMENT_OTHER): Payer: Federal, State, Local not specified - PPO | Admitting: Family Medicine

## 2023-04-08 ENCOUNTER — Ambulatory Visit
Admission: EM | Admit: 2023-04-08 | Discharge: 2023-04-08 | Disposition: A | Discharge status: Home / Self Care | Payer: Federal, State, Local not specified - PPO

## 2023-04-08 DIAGNOSIS — S0993XA Unspecified injury of face, initial encounter: Secondary | ICD-10-CM | POA: Diagnosis not present

## 2023-04-08 NOTE — ED Provider Notes (Signed)
[facility]    CSN: [id number] Arrival date & time: 04/08/23  1830      History   Chief Complaint Chief Complaint  Patient presents with   Oral Swelling    HPI Curtis Stark is a 23 y.o. male.  Concerns for left lower lip swelling noticed this morning Accidentally bit his lip last night while eating. He also popped a white head in this area externally. Denies itching of tongue or throat No trouble breathing or swallowing No interventions yet  Past Medical History:  Diagnosis Date   Seizures (HCC)     There are no active problems to display for this patient.   History reviewed. No pertinent surgical history.   Home Medications    Prior to Admission medications   Medication Sig Start Date End Date Taking? Authorizing Provider  albuterol (VENTOLIN HFA) 108 (90 Base) MCG/ACT inhaler Inhale 2 puffs into the lungs every 6 (six) hours as needed for wheezing or shortness of breath. 12/15/20   Hunsucker, Lesia Sago, MD  budesonide-formoterol (SYMBICORT) 160-4.5 MCG/ACT inhaler Inhale 2 puffs into the lungs in the morning and at bedtime. 12/15/20   Hunsucker, Lesia Sago, MD  cetirizine (ZYRTEC) 10 MG tablet Take 10 mg by mouth daily.    [provider]  cholecalciferol (VITAMIN D3) 25 MCG (1000 UNIT) tablet Take 1,000 Units by mouth daily. Patient not taking: Reported on 10/29/2022    [provider]  fluticasone (FLONASE) 50 MCG/ACT nasal spray Place into both nostrils daily.    [provider]  guanFACINE (TENEX) 1 MG tablet Take 1 mg by mouth in the morning and at bedtime. Patient not taking: Reported on 10/29/2022    [provider]  Lacosamide 100 MG TABS Take 1 tablet (100 mg total) by mouth 2 (two) times daily. 01/04/23 07/03/23  Huston Foley, MD  Vitamin D, Ergocalciferol, (DRISDOL) 1.25 MG (50000 UNIT) CAPS capsule Take 1 capsule (50,000 Units total) by mouth every 7 (seven) days. Patient not taking: Reported on 10/29/2022 12/14/20    Windell Norfolk, MD    Family History History reviewed. No pertinent family history.  Social History Social History   Tobacco Use   Smoking status: Never   Smokeless tobacco: Never  Substance Use Topics   Alcohol use: Never   Drug use: Never     Allergies   Carbamazepine, Clonazepam, Levetiracetam, Other, Topiramate, Lamotrigine, and Valproic acid   Review of Systems Review of Systems Per HPI  Physical Exam Triage Vital Signs ED Triage Vitals  Encounter Vitals Group     BP 04/08/23 1846 126/83     Systolic BP Percentile --      Diastolic BP Percentile --      Pulse Rate 04/08/23 1846 69     Resp 04/08/23 1846 16     Temp 04/08/23 1846 98.8 F (37.1 C)     Temp Source 04/08/23 1846 Oral     SpO2 04/08/23 1846 100 %     Weight --      Height --      Head Circumference --      Peak Flow --      Pain Score 04/08/23 1848 1     Pain Loc --      Pain Education --      Exclude from Growth Chart --    No data found.  Updated Vital Signs BP 126/83 (BP Location: Right Arm)   Pulse 69   Temp 98.8 F (37.1 C) (  Oral)   Resp 16   SpO2 100%   Physical Exam Vitals and nursing note reviewed.  Constitutional:      General: He is not in acute distress. HENT:     Mouth/Throat:     Mouth: Mucous membranes are moist. Injury present. No oral lesions.     Pharynx: Oropharynx is clear.      Comments: Small area of swelling on left lower lip. There is small scratch inner lip, likely where lip was bit. Speaks in full sentences, no swelling of tongue Eyes:     Conjunctiva/sclera: Conjunctivae normal.  Cardiovascular:     Rate and Rhythm: Normal rate and regular rhythm.     Heart sounds: Normal heart sounds.  Pulmonary:     Effort: Pulmonary effort is normal.     Breath sounds: Normal breath sounds.  Neurological:     Mental Status: He is alert and oriented to person, place, and time.      UC Treatments / Results  Labs (all labs ordered are listed, but only  abnormal results are displayed) Labs Reviewed - No data to display  EKG   Radiology No results found.  Procedures Procedures (including critical care time)  Medications Ordered in UC Medications - No data to display  Initial Impression / Assessment and Plan / UC Course  I have reviewed the triage vital signs and the nursing notes.  Pertinent labs & imaging results that were available during my care of the patient were reviewed by me and considered in my medical decision making (see chart for details).  Mild lip swelling, localized, from mild trauma Can use benadryl, cool compress to reduce swelling No red flags, can return if needed  Final Clinical Impressions(s) / UC Diagnoses   Final diagnoses:  Injury of lip, initial encounter     Discharge Instructions      Try benadryl at night Cool compress or ice can also help     ED Prescriptions   None    PDMP not reviewed this encounter.   Kathrine Haddock 04/08/23 1919

## 2023-04-08 NOTE — ED Triage Notes (Signed)
Pt states he bit his lower lip yesterday when he was eating. States this morning his lip was swollen.

## 2023-04-08 NOTE — Discharge Instructions (Addendum)
Try benadryl at night Cool compress or ice can also help

## 2023-04-10 ENCOUNTER — Ambulatory Visit
Admission: RE | Admit: 2023-04-10 | Discharge: 2023-04-10 | Disposition: A | Discharge status: Home / Self Care | Payer: Federal, State, Local not specified - PPO | Source: Ambulatory Visit | Attending: Family Medicine | Admitting: Family Medicine

## 2023-04-10 VITALS — BP 125/74 | HR 68 | Temp 98.9°F | Resp 16

## 2023-04-10 DIAGNOSIS — L089 Local infection of the skin and subcutaneous tissue, unspecified: Secondary | ICD-10-CM

## 2023-04-10 DIAGNOSIS — K130 Diseases of lips: Secondary | ICD-10-CM

## 2023-04-10 DIAGNOSIS — S00501A Unspecified superficial injury of lip, initial encounter: Secondary | ICD-10-CM

## 2023-04-10 MED ORDER — VALACYCLOVIR HCL 1 G PO TABS
1000.0000 mg | ORAL_TABLET | Freq: Three times a day (TID) | ORAL | 0 refills | Status: AC
Start: 2023-04-10 — End: ?

## 2023-04-10 MED ORDER — AMOXICILLIN-POT CLAVULANATE 875-125 MG PO TABS
1.0000 | ORAL_TABLET | Freq: Two times a day (BID) | ORAL | 0 refills | Status: AC
Start: 2023-04-10 — End: ?

## 2023-04-10 MED ORDER — IBUPROFEN 600 MG PO TABS
600.0000 mg | ORAL_TABLET | Freq: Four times a day (QID) | ORAL | 0 refills | Status: AC | PRN
Start: 2023-04-10 — End: ?

## 2023-04-10 NOTE — ED Triage Notes (Signed)
Pt states he bit bottom lip 1/20-was seen here same day-states increase in swelling and pus x today-NAD-steady gait

## 2023-04-10 NOTE — Discharge Instructions (Signed)
Primarily I recommend you take Augmentin for a lip infection from the bite wound. Use ibuprofen for pain and inflammation, swelling. For your concern of cold sore, you can use valacyclovir 3 times a day for 7 days. If you get worse, then go to the emergency room.

## 2023-04-10 NOTE — ED Provider Notes (Signed)
[facility]  Note:  This document was prepared using Conservation officer, historic buildings and may include unintentional dictation errors.  MRN: [id number] DOB: 2000-08-31  Subjective:   Curtis Stark is a 23 y.o. male presenting for 3-day history of acute onset persistent and worsening lower lip swelling, pain.  Was seen the same day of visit.  Advised to do Benadryl and cool compresses.  Symptoms started from accidentally biting his lip.  Has since developed more swelling, pain and drainage of pus yesterday.  No current facility-administered medications for this encounter.  Current Outpatient Medications:    amoxicillin-clavulanate (AUGMENTIN) 875-125 MG tablet, Take 1 tablet by mouth 2 (two) times daily., Disp: 20 tablet, Rfl: 0   ibuprofen (ADVIL) 600 MG tablet, Take 1 tablet (600 mg total) by mouth every 6 (six) hours as needed., Disp: 30 tablet, Rfl: 0   valACYclovir (VALTREX) 1000 MG tablet, Take 1 tablet (1,000 mg total) by mouth 3 (three) times daily., Disp: 21 tablet, Rfl: 0   albuterol (VENTOLIN HFA) 108 (90 Base) MCG/ACT inhaler, Inhale 2 puffs into the lungs every 6 (six) hours as needed for wheezing or shortness of breath., Disp: 1 each, Rfl: 6   budesonide-formoterol (SYMBICORT) 160-4.5 MCG/ACT inhaler, Inhale 2 puffs into the lungs in the morning and at bedtime., Disp: 1 each, Rfl: 6   cetirizine (ZYRTEC) 10 MG tablet, Take 10 mg by mouth daily., Disp: , Rfl:    cholecalciferol (VITAMIN D3) 25 MCG (1000 UNIT) tablet, Take 1,000 Units by mouth daily. (Patient not taking: Reported on 10/29/2022), Disp: , Rfl:    fluticasone (FLONASE) 50 MCG/ACT nasal spray, Place into both nostrils daily., Disp: , Rfl:    guanFACINE (TENEX) 1 MG tablet, Take 1 mg by mouth in the morning and at bedtime. (Patient not taking: Reported on 10/29/2022), Disp: , Rfl:    Lacosamide 100 MG TABS, Take 1 tablet (100 mg total) by mouth 2 (two) times daily., Disp: 180 tablet, Rfl: 1    Vitamin D, Ergocalciferol, (DRISDOL) 1.25 MG (50000 UNIT) CAPS capsule, Take 1 capsule (50,000 Units total) by mouth every 7 (seven) days. (Patient not taking: Reported on 10/29/2022), Disp: 8 capsule, Rfl: 0   Allergies  Allergen Reactions   Carbamazepine Hives, Itching, Rash and Swelling    Red man rash   Clonazepam     Red man rash  Other reaction(s): Vancomycin Infusion Reaction Other reaction(s): Red Man Syndrome Can take in low doses for emergencies only  Can take in low doses for emergencies only  Other reaction(s): Red Man Syndrome Can take in low doses for emergencies only  Other reaction(s): Red Man Syndrome Can take in low doses for emergencies only  Can take in low doses for emergencies only  Can take in low doses for emergencies only  Can take in low doses for emergencies only  Other reaction(s): Vancomycin Infusion Reaction Can take in low doses for emergencies only  Other reaction(s): Red Man Syndrome Can take in low doses for emergencies only  Can take in low doses for emergencies only  Other reaction(s): Red Man Syndrome Can take in low doses for emergencies only  Other reaction(s): Red Man Syndrome Can take in low doses for emergencies only  Can take in low doses for emergencies only  Can take in low doses for emergencies only  Can take in low doses for emergencies only  Can take in low doses for emergencies only     Levetiracetam Other (See  Comments)    Mood changes Emotional, crying, aggressive Other reaction(s): Aggression Emotional, crying, aggressive Emotional, crying, aggressive Emotional, crying, aggressive  Emotional, crying, aggressive Emotional, crying, aggressive Other reaction(s): Other (See Comments), Other (See Comments), Other (see comments) Emotional, crying, aggressive Emotional, crying, aggressive Other reaction(s): Aggression Emotional, crying, aggressive Emotional, crying, aggressive Emotional, crying, aggressive  Emotional,  crying, aggressive Emotional, crying, aggressive Emotional, crying, aggressive    Other Other (See Comments)    Other reaction(s): Other Seasonal allergies Seasonal allergies Other reaction(s): Other (See Comments) Seasonal allergies Seasonal allergies Seasonal allergies Seasonal allergies Seasonal allergies Other reaction(s): Other (see comments), Other (See Comments) Seasonal allergies Seasonal allergies Seasonal allergies Other reaction(s): Other (See Comments) Seasonal allergies Seasonal allergies Seasonal allergies Seasonal allergies Seasonal allergies Seasonal allergies    Topiramate Other (See Comments)    Per mom made him have more seizures., Per mom made him have more seizures., Per mom made him have more seizures., Per mom made him have more seizures., Per mom made him have more seizures., Per mom made him have more seizures., Per mom made him have more seizures., Other reaction(s): Other (see comments), Other (See Comments) Per mom made him have more seizures., Per mom made him have more seizures., Per mom made him have more seizures., Per mom made him have more seizures., Per mom made him have more seizures., Per mom made him have more seizures., Per mom made him have more seizures., Per mom made him have more seizures., Per mom made him have more seizures.,    Lamotrigine Rash    Red man rash  Drug rash  Drug rash  Other reaction(s): Rash Drug rash  Drug rash  Drug rash  Drug rash  Drug rash  Drug rash  Drug rash  Drug rash  Other reaction(s): Rash Drug rash  Drug rash  Drug rash  Drug rash  Drug rash  Drug rash     Valproic Acid Other (See Comments) and Rash    Other reaction(s): Other Aggression Aggression Other reaction(s): Aggression, Other (See Comments) Aggression Aggression Aggression Aggression  Aggression Aggression Other reaction(s): Other (See Comments), Other (See Comments), Other (see  comments) Aggression Aggression Aggression Other reaction(s): Aggression, Other (See Comments) Aggression Aggression Aggression Aggression  Aggression Aggression Aggression     Past Medical History:  Diagnosis Date   Seizures (HCC)      History reviewed. No pertinent surgical history.  No family history on file.  Social History   Tobacco Use   Smoking status: Never   Smokeless tobacco: Never  Vaping Use   Vaping status: Never Used  Substance Use Topics   Alcohol use: Never   Drug use: Never    ROS   Objective:   Vitals: BP 125/74 (BP Location: Right Arm)   Pulse 68   Temp 98.9 F (37.2 C) (Oral)   Resp 16   SpO2 96%   Physical Exam Constitutional:      General: He is not in acute distress.    Appearance: Normal appearance. He is well-developed and normal weight. He is not ill-appearing, toxic-appearing or diaphoretic.  HENT:     Head: Normocephalic and atraumatic.     Right Ear: External ear normal.     Left Ear: External ear normal.     Nose: Nose normal.     Mouth/Throat:     Pharynx: Oropharynx is clear.   Eyes:     General: No scleral icterus.       Right eye: No discharge.  Left eye: No discharge.     Extraocular Movements: Extraocular movements intact.  Cardiovascular:     Rate and Rhythm: Normal rate.  Pulmonary:     Effort: Pulmonary effort is normal.  Musculoskeletal:     Cervical back: Normal range of motion.  Neurological:     Mental Status: He is alert and oriented to person, place, and time.  Psychiatric:        Mood and Affect: Mood normal.        Behavior: Behavior normal.        Thought Content: Thought content normal.        Judgment: Judgment normal.     Assessment and Plan :   PDMP not reviewed this encounter.  1. Superficial injury of lip with infection, initial encounter   2. Traumatic lip pain   3. Lip pain    Recommended covering for secondary infection with Augmentin.  Use ibuprofen for pain  relief.  Patient had concerns about a cold sore and would like to cover for this.  I do not see any signs and symptoms but is a low risk medication and therefore offered valacyclovir.  Low suspicion for angioedema.  Counseled patient on potential for adverse effects with medications prescribed/recommended today, ER and return-to-clinic precautions discussed, patient verbalized understanding.    Wallis Bamberg, PA-C 04/10/23 1214

## 2023-04-30 ENCOUNTER — Encounter: Payer: Self-pay | Admitting: Neurology

## 2023-04-30 ENCOUNTER — Ambulatory Visit: Payer: Federal, State, Local not specified - PPO | Admitting: Neurology

## 2024-01-03 ENCOUNTER — Encounter: Payer: Self-pay | Admitting: Emergency Medicine

## 2024-01-03 ENCOUNTER — Ambulatory Visit
Admission: EM | Admit: 2024-01-03 | Discharge: 2024-01-03 | Disposition: A | Discharge status: Home / Self Care | Attending: Family Medicine | Admitting: Family Medicine

## 2024-01-03 DIAGNOSIS — M791 Myalgia, unspecified site: Secondary | ICD-10-CM

## 2024-01-03 DIAGNOSIS — B349 Viral infection, unspecified: Secondary | ICD-10-CM | POA: Diagnosis not present

## 2024-01-03 LAB — POC COVID19/FLU A&B COMBO
Covid Antigen, POC: NEGATIVE
Influenza A Antigen, POC: NEGATIVE
Influenza B Antigen, POC: NEGATIVE

## 2024-01-03 MED ORDER — KETOROLAC TROMETHAMINE 30 MG/ML IJ SOLN
15.0000 mg | Freq: Once | INTRAMUSCULAR | Status: AC
  Administered 2024-01-03: 15 mg via INTRAMUSCULAR

## 2024-01-03 NOTE — ED Triage Notes (Signed)
 PT c/o generalized body aches and pain onset yesterday  Denies cough or cold symptoms

## 2024-01-03 NOTE — Discharge Instructions (Addendum)
 You were given a Toradol injection in clinic today. Do not take any over the counter NSAID's such as Advil , ibuprofen , Aleve, or naproxen for 24 hours. You may take tylenol if needed.  Lots of rest and fluids.  Please follow-up with your PCP if your symptoms do not improve.  Please go to the ER for any worsening symptoms.  Hope you feel better soon!

## 2024-01-03 NOTE — ED Provider Notes (Signed)
 [facility]    CSN: [id number] Arrival date & time: 01/03/24  1905      History   Chief Complaint Chief Complaint  Patient presents with   Generalized Body Aches    HPI Curtis Stark is a 23 y.o. male  presents for evaluation of URI symptoms for 1 days. Patient reports associated symptoms of generalized bodyaches. Denies N/V/D, cough, congestion, sore throat, ear pain, shortness of breath. Patient does have a hx of asthma. Patient is not an active smoker.   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  Pt has taken 200mg  ibuprofen  taken 8 hours ago.  OTC for symptoms. Pt has no other concerns at this time.   HPI  Past Medical History:  Diagnosis Date   Seizures (HCC)     There are no active problems to display for this patient.   History reviewed. No pertinent surgical history.     Home Medications    Prior to Admission medications   Medication Sig Start Date End Date Taking? Authorizing Provider  albuterol  (VENTOLIN  HFA) 108 (90 Base) MCG/ACT inhaler Inhale 2 puffs into the lungs every 6 (six) hours as needed for wheezing or shortness of breath. 12/15/20   Hunsucker, Donnice SAUNDERS, MD  amoxicillin -clavulanate (AUGMENTIN ) 875-125 MG tablet Take 1 tablet by mouth 2 (two) times daily. Patient not taking: Reported on 01/03/2024 04/10/23   Christopher Savannah, PA-C  budesonide -formoterol  (SYMBICORT ) 160-4.5 MCG/ACT inhaler Inhale 2 puffs into the lungs in the morning and at bedtime. 12/15/20   Hunsucker, Donnice SAUNDERS, MD  cetirizine (ZYRTEC) 10 MG tablet Take 10 mg by mouth daily.    [provider]  cholecalciferol (VITAMIN D3) 25 MCG (1000 UNIT) tablet Take 1,000 Units by mouth daily. Patient not taking: Reported on 10/29/2022    [provider]  fluticasone (FLONASE) 50 MCG/ACT nasal spray Place into both nostrils daily.    [provider]  guanFACINE (TENEX) 1 MG tablet Take 1 mg by mouth in the morning and at bedtime. Patient not taking:  Reported on 10/29/2022    [provider]  ibuprofen  (ADVIL ) 600 MG tablet Take 1 tablet (600 mg total) by mouth every 6 (six) hours as needed. 04/10/23   Christopher Savannah, PA-C  Lacosamide  100 MG TABS Take 1 tablet (100 mg total) by mouth 2 (two) times daily. 01/04/23 07/03/23  Athar, Saima, MD  valACYclovir  (VALTREX ) 1000 MG tablet Take 1 tablet (1,000 mg total) by mouth 3 (three) times daily. Patient not taking: Reported on 01/03/2024 04/10/23   Christopher Savannah, PA-C  Vitamin D , Ergocalciferol , (DRISDOL ) 1.25 MG (50000 UNIT) CAPS capsule Take 1 capsule (50,000 Units total) by mouth every 7 (seven) days. Patient not taking: Reported on 10/29/2022 12/14/20   Camara, Amadou, MD    Family History No family history on file.  Social History Social History   Tobacco Use   Smoking status: Never   Smokeless tobacco: Never  Vaping Use   Vaping status: Some Days  Substance Use Topics   Alcohol use: Yes    Comment: social   Drug use: Never     Allergies   Carbamazepine, Clonazepam, Levetiracetam, Other, Topiramate, Lamotrigine, and Valproic acid   Review of Systems Review of Systems  Musculoskeletal:  Positive for myalgias.     Physical Exam Triage Vital Signs ED Triage Vitals  Encounter Vitals Group     BP 01/03/24 1931 136/73     Girls Systolic BP Percentile --  Girls Diastolic BP Percentile --      Boys Systolic BP Percentile --      Boys Diastolic BP Percentile --      Pulse Rate 01/03/24 1931 82     Resp 01/03/24 1931 16     Temp 01/03/24 1931 98.3 F (36.8 C)     Temp Source 01/03/24 1931 Oral     SpO2 01/03/24 1931 96 %     Weight 01/03/24 1932 224 lb (101.6 kg)     Height 01/03/24 1932 6' 1 (1.854 m)     Head Circumference --      Peak Flow --      Pain Score 01/03/24 1931 8     Pain Loc --      Pain Education --      Exclude from Growth Chart --    No data found.  Updated Vital Signs BP 136/73 (BP Location: Right Arm)   Pulse 82   Temp 98.3 F (36.8  C) (Oral)   Resp 16   Ht 6' 1 (1.854 m)   Wt 224 lb (101.6 kg)   SpO2 96%   BMI 29.55 kg/m   Visual Acuity Right Eye Distance:   Left Eye Distance:   Bilateral Distance:    Right Eye Near:   Left Eye Near:    Bilateral Near:     Physical Exam Vitals and nursing note reviewed.  Constitutional:      General: He is not in acute distress.    Appearance: Normal appearance. He is not ill-appearing or toxic-appearing.  HENT:     Head: Normocephalic and atraumatic.     Right Ear: Tympanic membrane and ear canal normal.     Left Ear: Tympanic membrane and ear canal normal.     Nose: No congestion.     Mouth/Throat:     Mouth: Mucous membranes are moist.     Pharynx: No posterior oropharyngeal erythema.  Eyes:     Pupils: Pupils are equal, round, and reactive to light.  Cardiovascular:     Rate and Rhythm: Normal rate and regular rhythm.     Heart sounds: Normal heart sounds.  Pulmonary:     Effort: Pulmonary effort is normal.     Breath sounds: Normal breath sounds. No wheezing or rhonchi.  Musculoskeletal:     Cervical back: Normal range of motion and neck supple.  Lymphadenopathy:     Cervical: No cervical adenopathy.  Skin:    General: Skin is warm and dry.  Neurological:     General: No focal deficit present.     Mental Status: He is alert and oriented to person, place, and time.  Psychiatric:        Mood and Affect: Mood normal.        Behavior: Behavior normal.      UC Treatments / Results  Labs (all labs ordered are listed, but only abnormal results are displayed) Labs Reviewed  POC COVID19/FLU A&B COMBO - Normal    EKG   Radiology No results found.  Procedures Procedures (including critical care time)  Medications Ordered in UC Medications  ketorolac (TORADOL) 30 MG/ML injection 15 mg (15 mg Intramuscular Given 01/03/24 1953)    Initial Impression / Assessment and Plan / UC Course  I have reviewed the triage vital signs and the nursing  notes.  Pertinent labs & imaging results that were available during my care of the patient were reviewed by me and considered in my medical decision  making (see chart for details).     Reviewed exam and symptoms with patient.  No red flags.  Negative COVID and flu testing.  Patient given Toradol injection in clinic for body aches with improvement.  Instructed no NSAIDs for 24 hours and he verbalized understanding.  Discussed viral illness and symptomatic treatment.  PCP follow-up if symptoms do not improve.  ER precautions reviewed. Final Clinical Impressions(s) / UC Diagnoses   Final diagnoses:  Myalgia  Viral illness     Discharge Instructions      You were given a Toradol injection in clinic today. Do not take any over the counter NSAID's such as Advil , ibuprofen , Aleve, or naproxen for 24 hours. You may take tylenol if needed.  Lots of rest and fluids.  Please follow-up with your PCP if your symptoms do not improve.  Please go to the ER for any worsening symptoms.  Hope you feel better soon!      ED Prescriptions   None    PDMP not reviewed this encounter.   Loreda Myla SAUNDERS, NP 01/03/24 [phone]

## 2024-10-08 ENCOUNTER — Ambulatory Visit: Admitting: Neurology
